# Patient Record
Sex: Female | Born: 1955
Health system: Southern US, Community
[De-identification: ages and names within clinical notes are randomized; demographics above are authoritative.]

## PROBLEM LIST (undated history)

## (undated) DIAGNOSIS — E669 Obesity, unspecified: Secondary | ICD-10-CM

## (undated) DIAGNOSIS — R112 Nausea with vomiting, unspecified: Secondary | ICD-10-CM

## (undated) DIAGNOSIS — E039 Hypothyroidism, unspecified: Secondary | ICD-10-CM

## (undated) DIAGNOSIS — Z9889 Other specified postprocedural states: Secondary | ICD-10-CM

## (undated) DIAGNOSIS — K5790 Diverticulosis of intestine, part unspecified, without perforation or abscess without bleeding: Secondary | ICD-10-CM

## (undated) DIAGNOSIS — H8110 Benign paroxysmal vertigo, unspecified ear: Secondary | ICD-10-CM

## (undated) DIAGNOSIS — K219 Gastro-esophageal reflux disease without esophagitis: Secondary | ICD-10-CM

## (undated) DIAGNOSIS — H269 Unspecified cataract: Secondary | ICD-10-CM

## (undated) DIAGNOSIS — M1990 Unspecified osteoarthritis, unspecified site: Secondary | ICD-10-CM

## (undated) DIAGNOSIS — E785 Hyperlipidemia, unspecified: Secondary | ICD-10-CM

## (undated) HISTORY — PX: MANDIBLE SURGERY: SHX707

## (undated) HISTORY — DX: Hyperlipidemia, unspecified: E78.5

## (undated) HISTORY — PX: POLYPECTOMY: SHX149

## (undated) HISTORY — DX: Unspecified osteoarthritis, unspecified site: M19.90

## (undated) HISTORY — DX: Gastro-esophageal reflux disease without esophagitis: K21.9

## (undated) HISTORY — DX: Diverticulosis of intestine, part unspecified, without perforation or abscess without bleeding: K57.90

## (undated) HISTORY — DX: Hypothyroidism, unspecified: E03.9

## (undated) HISTORY — PX: HEMORRHOID BANDING: SHX5850

## (undated) HISTORY — PX: COLONOSCOPY W/ POLYPECTOMY: SHX1380

## (undated) HISTORY — PX: COLONOSCOPY: SHX174

## (undated) HISTORY — PX: OTHER SURGICAL HISTORY: SHX169

## (undated) HISTORY — DX: Obesity, unspecified: E66.9

## (undated) HISTORY — DX: Unspecified cataract: H26.9

---

## 2014-07-27 ENCOUNTER — Telehealth: Payer: Self-pay

## 2014-07-27 NOTE — Telephone Encounter (Signed)
Pt came by the office and left a copy of her insurance care and wants to be scheduled for colonoscopy.  Her PCP in [place] is retiring and she will be seeing new PCP in a week or so. She said she will get back with me then, she wants then to send the referral to [place]. She will call.

## 2014-08-05 ENCOUNTER — Encounter (INDEPENDENT_AMBULATORY_CARE_PROVIDER_SITE_OTHER): Payer: Self-pay | Admitting: *Deleted

## 2014-09-07 ENCOUNTER — Encounter (INDEPENDENT_AMBULATORY_CARE_PROVIDER_SITE_OTHER): Payer: Self-pay | Admitting: *Deleted

## 2014-10-11 ENCOUNTER — Ambulatory Visit (INDEPENDENT_AMBULATORY_CARE_PROVIDER_SITE_OTHER): Payer: Self-pay | Admitting: Internal Medicine

## 2014-12-07 ENCOUNTER — Ambulatory Visit (INDEPENDENT_AMBULATORY_CARE_PROVIDER_SITE_OTHER): Payer: PRIVATE HEALTH INSURANCE | Admitting: Internal Medicine

## 2014-12-07 ENCOUNTER — Telehealth (INDEPENDENT_AMBULATORY_CARE_PROVIDER_SITE_OTHER): Payer: Self-pay | Admitting: *Deleted

## 2014-12-07 ENCOUNTER — Other Ambulatory Visit (INDEPENDENT_AMBULATORY_CARE_PROVIDER_SITE_OTHER): Payer: Self-pay | Admitting: *Deleted

## 2014-12-07 ENCOUNTER — Encounter (INDEPENDENT_AMBULATORY_CARE_PROVIDER_SITE_OTHER): Payer: Self-pay | Admitting: Internal Medicine

## 2014-12-07 VITALS — BP 132/60 | HR 60 | Temp 98.4°F | Ht 68.0 in | Wt 195.0 lb

## 2014-12-07 DIAGNOSIS — K219 Gastro-esophageal reflux disease without esophagitis: Secondary | ICD-10-CM | POA: Diagnosis not present

## 2014-12-07 DIAGNOSIS — Z1211 Encounter for screening for malignant neoplasm of colon: Secondary | ICD-10-CM

## 2014-12-07 MED ORDER — PEG 3350-KCL-NA BICARB-NACL 420 G PO SOLR: 4000.0000 mL | Freq: Once | ORAL | Status: DC

## 2014-12-07 NOTE — Progress Notes (Signed)
   Subjective:    Patient ID: Cynthia Ellison, female    DOB: 1956-02-22, 59 y.o.   MRN: [id number]  HPI 59 yr old female referred by Dr. Manuella Ghazi for GERD. Patient is due for a colonoscopy this GERD. She tells me her reflux is worse. Prilosec is not controlling her symptoms completely. She is concerned about Barrett's esophagus. 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 She is not having any dysphagia. She does cough a lot at night. Her appetite is good. No weight loss. She denies any abdominal. She usually has a BM daily. No melena or BRRB. She is not exercising.    Her last colonoscopy was by Dr. Paul Dykes in 2006 and an EGD in 2008 for dysphagia, and GERD. Colonoscopy revealed Hyperplastic polyp. Diverticulosis, Enlarged internal hemorrhoids.  EGD in 2008 revealed: stricture of the GE junction, small hiatal hernia, Polyps in the stomach (biopsy). Otherwise normal EGD. (No biopsy sent).  .Review of Systems Past Medical History  Diagnosis Date  . GERD (gastroesophageal reflux disease)     Past Surgical History  Procedure Laterality Date  . Mandible surgery      hypoplasia.    No Known Allergies  No current outpatient prescriptions on file prior to visit.   No current facility-administered medications on file prior to visit.    Married. No children. 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  Alert and oriented. Skin warm and dry. Oral mucosa is moist.   . Sclera anicteric, conjunctivae is pink. Thyroid not enlarged. No cervical lymphadenopathy. Lungs clear. Heart regular rate and rhythm.  Abdomen is soft. Bowel sounds are positive. No hepatomegaly. No abdominal masses felt. No tenderness.  No edema to lower extremities.         Assessment & Plan:  GERD: not controlled at this time. She wants to wait till after her EGD before changing the  Prilosec. Screening colonoscopy.  Last colonoscopy 10 yrs ago.

## 2014-12-07 NOTE — Telephone Encounter (Signed)
Patient needs trilyte 

## 2014-12-07 NOTE — Patient Instructions (Signed)
EGD/Colonoscopy. The risks and benefits such as perforation, bleeding, and infection were reviewed with the patient and is agreeable. 

## 2014-12-09 ENCOUNTER — Encounter (INDEPENDENT_AMBULATORY_CARE_PROVIDER_SITE_OTHER): Payer: Self-pay

## 2015-01-11 ENCOUNTER — Other Ambulatory Visit (INDEPENDENT_AMBULATORY_CARE_PROVIDER_SITE_OTHER): Payer: Self-pay | Admitting: *Deleted

## 2015-01-11 DIAGNOSIS — Z1211 Encounter for screening for malignant neoplasm of colon: Secondary | ICD-10-CM

## 2015-02-09 ENCOUNTER — Encounter (HOSPITAL_COMMUNITY): Admission: RE | Payer: Self-pay | Source: Ambulatory Visit

## 2015-02-09 ENCOUNTER — Ambulatory Visit (HOSPITAL_COMMUNITY)
Admission: RE | Admit: 2015-02-09 | Discharge: 2015-02-09 | Disposition: A | Discharge status: Home / Self Care | Payer: PRIVATE HEALTH INSURANCE | Source: Ambulatory Visit | Attending: Internal Medicine | Admitting: Internal Medicine

## 2015-02-09 ENCOUNTER — Encounter (HOSPITAL_COMMUNITY)
Admission: RE | Disposition: A | Discharge status: Home / Self Care | Payer: Self-pay | Source: Ambulatory Visit | Attending: Internal Medicine

## 2015-02-09 ENCOUNTER — Encounter (HOSPITAL_COMMUNITY): Payer: Self-pay | Admitting: *Deleted

## 2015-02-09 ENCOUNTER — Ambulatory Visit (HOSPITAL_COMMUNITY)
Admission: RE | Admit: 2015-02-09 | Payer: PRIVATE HEALTH INSURANCE | Source: Ambulatory Visit | Admitting: Internal Medicine

## 2015-02-09 DIAGNOSIS — K5730 Diverticulosis of large intestine without perforation or abscess without bleeding: Secondary | ICD-10-CM | POA: Diagnosis not present

## 2015-02-09 DIAGNOSIS — K5790 Diverticulosis of intestine, part unspecified, without perforation or abscess without bleeding: Secondary | ICD-10-CM | POA: Diagnosis not present

## 2015-02-09 DIAGNOSIS — K219 Gastro-esophageal reflux disease without esophagitis: Secondary | ICD-10-CM | POA: Insufficient documentation

## 2015-02-09 DIAGNOSIS — H8110 Benign paroxysmal vertigo, unspecified ear: Secondary | ICD-10-CM | POA: Insufficient documentation

## 2015-02-09 DIAGNOSIS — K449 Diaphragmatic hernia without obstruction or gangrene: Secondary | ICD-10-CM | POA: Insufficient documentation

## 2015-02-09 DIAGNOSIS — D649 Anemia, unspecified: Secondary | ICD-10-CM | POA: Diagnosis not present

## 2015-02-09 DIAGNOSIS — K644 Residual hemorrhoidal skin tags: Secondary | ICD-10-CM | POA: Insufficient documentation

## 2015-02-09 DIAGNOSIS — K317 Polyp of stomach and duodenum: Secondary | ICD-10-CM | POA: Diagnosis not present

## 2015-02-09 DIAGNOSIS — K222 Esophageal obstruction: Secondary | ICD-10-CM | POA: Insufficient documentation

## 2015-02-09 DIAGNOSIS — Z1211 Encounter for screening for malignant neoplasm of colon: Secondary | ICD-10-CM | POA: Diagnosis not present

## 2015-02-09 DIAGNOSIS — K648 Other hemorrhoids: Secondary | ICD-10-CM | POA: Diagnosis not present

## 2015-02-09 DIAGNOSIS — K6389 Other specified diseases of intestine: Secondary | ICD-10-CM | POA: Insufficient documentation

## 2015-02-09 DIAGNOSIS — Z79899 Other long term (current) drug therapy: Secondary | ICD-10-CM | POA: Insufficient documentation

## 2015-02-09 DIAGNOSIS — R1310 Dysphagia, unspecified: Secondary | ICD-10-CM | POA: Diagnosis not present

## 2015-02-09 HISTORY — PX: COLONOSCOPY: SHX5424

## 2015-02-09 HISTORY — DX: Other specified postprocedural states: Z98.890

## 2015-02-09 HISTORY — DX: Nausea with vomiting, unspecified: R11.2

## 2015-02-09 HISTORY — DX: Benign paroxysmal vertigo, unspecified ear: H81.10

## 2015-02-09 HISTORY — PX: ESOPHAGOGASTRODUODENOSCOPY: SHX5428

## 2015-02-09 SURGERY — COLONOSCOPY
Anesthesia: Moderate Sedation

## 2015-02-09 MED ORDER — BUTAMBEN-TETRACAINE-BENZOCAINE 2-2-14 % EX AERO
INHALATION_SPRAY | CUTANEOUS | Status: DC | PRN
  Administered 2015-02-09: 2 via TOPICAL

## 2015-02-09 MED ORDER — MIDAZOLAM HCL 5 MG/5ML IJ SOLN
INTRAMUSCULAR | Status: AC
  Filled 2015-02-09: qty 10

## 2015-02-09 MED ORDER — SODIUM CHLORIDE 0.9 % IV SOLN
INTRAVENOUS | Status: DC
  Administered 2015-02-09: 12:00:00 via INTRAVENOUS

## 2015-02-09 MED ORDER — STERILE WATER FOR IRRIGATION IR SOLN
Status: DC | PRN
  Administered 2015-02-09: 12:00:00

## 2015-02-09 MED ORDER — SODIUM CHLORIDE 0.9 % IV SOLN: INTRAVENOUS | Status: DC

## 2015-02-09 MED ORDER — MEPERIDINE HCL 50 MG/ML IJ SOLN
INTRAMUSCULAR | Status: DC
Start: 2015-02-09 — End: 2015-02-09
  Filled 2015-02-09: qty 1

## 2015-02-09 MED ORDER — MEPERIDINE HCL 50 MG/ML IJ SOLN
INTRAMUSCULAR | Status: DC | PRN
  Administered 2015-02-09 (×3): 25 mg via INTRAVENOUS

## 2015-02-09 MED ORDER — MIDAZOLAM HCL 5 MG/5ML IJ SOLN
INTRAMUSCULAR | Status: DC | PRN
  Administered 2015-02-09 (×6): 2 mg via INTRAVENOUS

## 2015-02-09 MED ORDER — MIDAZOLAM HCL 5 MG/5ML IJ SOLN
INTRAMUSCULAR | Status: AC
  Filled 2015-02-09: qty 5

## 2015-02-09 MED ORDER — OMEPRAZOLE 40 MG PO CPDR: 40.0000 mg | DELAYED_RELEASE_CAPSULE | Freq: Every day | ORAL | Status: DC

## 2015-02-09 MED ORDER — MEPERIDINE HCL 50 MG/ML IJ SOLN
INTRAMUSCULAR | Status: AC
  Filled 2015-02-09: qty 1

## 2015-02-09 NOTE — Discharge Instructions (Signed)
Resume usual medications and high fiber diet. Increase omeprazole to 40 mg by mouth 30 minutes before breakfast daily. No driving for 24 hours. Office visit in 3 months.  High-Fiber Diet Fiber is found in fruits, vegetables, and grains. A high-fiber diet encourages the addition of more whole grains, legumes, fruits, and vegetables in your diet. The recommended amount of fiber for adult males is 38 g per day. For adult females, it is 25 g per day. Pregnant and lactating women should get 28 g of fiber per day. If you have a digestive or bowel problem, ask your caregiver for advice before adding high-fiber foods to your diet. Eat a variety of high-fiber foods instead of only a select few type of foods.  PURPOSE  To increase stool bulk.  To make bowel movements more regular to prevent constipation.  To lower cholesterol.  To prevent overeating. WHEN IS THIS DIET USED?  It may be used if you have constipation and hemorrhoids.  It may be used if you have uncomplicated diverticulosis (intestine condition) and irritable bowel syndrome.  It may be used if you need help with weight management.  It may be used if you want to add it to your diet as a protective measure against atherosclerosis, diabetes, and cancer. SOURCES OF FIBER  Whole-grain breads and cereals.  Fruits, such as apples, oranges, bananas, berries, prunes, and pears.  Vegetables, such as green peas, carrots, sweet potatoes, beets, broccoli, cabbage, spinach, and artichokes.  Legumes, such split peas, soy, lentils.  Almonds. FIBER CONTENT IN FOODS Starches and Grains / Dietary Fiber (g)  Cheerios, 1 cup / 3 g  Corn Flakes cereal, 1 cup / 0.7 g  Rice crispy treat cereal, 1 cup / 0.3 g  Instant oatmeal (cooked),  cup / 2 g  Frosted wheat cereal, 1 cup / 5.1 g  Brown, long-grain rice (cooked), 1 cup / 3.5 g  White, long-grain rice (cooked), 1 cup / 0.6 g  Enriched macaroni (cooked), 1 cup / 2.5 g Legumes /  Dietary Fiber (g)  Baked beans (canned, plain, or vegetarian),  cup / 5.2 g  Kidney beans (canned),  cup / 6.8 g  Pinto beans (cooked),  cup / 5.5 g Breads and Crackers / Dietary Fiber (g)  Plain or honey graham crackers, 2 squares / 0.7 g  Saltine crackers, 3 squares / 0.3 g  Plain, salted pretzels, 10 pieces / 1.8 g  Whole-wheat bread, 1 slice / 1.9 g  White bread, 1 slice / 0.7 g  Raisin bread, 1 slice / 1.2 g  Plain bagel, 3 oz / 2 g  Flour tortilla, 1 oz / 0.9 g  Corn tortilla, 1 small / 1.5 g  Hamburger or hotdog bun, 1 small / 0.9 g Fruits / Dietary Fiber (g)  Apple with skin, 1 medium / 4.4 g  Sweetened applesauce,  cup / 1.5 g  Banana,  medium / 1.5 g  Grapes, 10 grapes / 0.4 g  Orange, 1 small / 2.3 g  Raisin, 1.5 oz / 1.6 g  Melon, 1 cup / 1.4 g Vegetables / Dietary Fiber (g)  Green beans (canned),  cup / 1.3 g  Carrots (cooked),  cup / 2.3 g  Broccoli (cooked),  cup / 2.8 g  Peas (cooked),  cup / 4.4 g  Mashed potatoes,  cup / 1.6 g  Lettuce, 1 cup / 0.5 g  Corn (canned),  cup / 1.6 g  Tomato,  cup / 1.1 g Document  Released: 08/05/2005 Document Revised: 02/04/2012 Document Reviewed: 11/07/2011 [facility] Patient Information 2015 [place], [place]. This information is not intended to replace advice given to you by your health care provider. Make sure you discuss any questions you have with your health care provider.  Esophagogastroduodenoscopy Care After Refer to this sheet in the next few weeks. These instructions provide you with information on caring for yourself after your procedure. Your caregiver may also give you more specific instructions. Your treatment has been planned according to current medical practices, but problems sometimes occur. Call your caregiver if you have any problems or questions after your procedure.  HOME CARE INSTRUCTIONS  Do not eat or drink anything until the numbing medicine (local anesthetic) has  worn off and your gag reflex has returned. You will know that the local anesthetic has worn off when you can swallow comfortably.  Do not drive for 12 hours after the procedure or as directed by your caregiver.  Only take medicines as directed by your caregiver. SEEK MEDICAL CARE IF:   You cannot stop coughing.  You are not urinating at all or less than usual. SEEK IMMEDIATE MEDICAL CARE IF:  You have difficulty swallowing.  You cannot eat or drink.  You have worsening throat or chest pain.  You have dizziness, lightheadedness, or you faint.  You have nausea or vomiting.  You have chills.  You have a fever.  You have severe abdominal pain.  You have black, tarry, or bloody stools. Document Released: 07/22/2012 Document Reviewed: 07/22/2012 [facility] Patient Information 2015 [place]. This information is not intended to replace advice given to you by your health care provider. Make sure you discuss any questions you have with your health care provider.  PATIENT INSTRUCTIONS POST-ANESTHESIA  IMMEDIATELY FOLLOWING SURGERY:  Do not drive or operate machinery for the first twenty four hours after surgery.  Do not make any important decisions for twenty four hours after surgery or while taking narcotic pain medications or sedatives.  If you develop intractable nausea and vomiting or a severe headache please notify your doctor immediately.  FOLLOW-UP:  Please make an appointment with your surgeon as instructed. You do not need to follow up with anesthesia unless specifically instructed to do so.  WOUND CARE INSTRUCTIONS (if applicable):  Keep a dry clean dressing on the anesthesia/puncture wound site if there is drainage.  Once the wound has quit draining you may leave it open to air.  Generally you should leave the bandage intact for twenty four hours unless there is drainage.  If the epidural site drains for more than 36-48 hours please call the anesthesia  department.  QUESTIONS?:  Please feel free to call your physician or the hospital operator if you have any questions, and they will be happy to assist you.

## 2015-02-09 NOTE — H&P (Signed)
Cynthia Ellison is an 59 y.o. female.   Chief Complaint: Patient is here for EGD, ED and colonoscopy. HPI: Patient is 59-year-old Caucasian female who is here for average risk screening colonoscopy. Her last exam was 10 years ago with removal of single polyp which was non-adenomatous. She she also has chronic GERD. She is on omeprazole at heartburn is not controlled to her satisfaction. She did try Nexium but wasn't any better. She has history of Schatzki's ring. Was last dilated in 2008. She did have an episode of solid food dysphagia and she was eating an egg. She had spontaneous early within few minutes. She denies nausea vomiting melena or rectal bleeding. Family history is negative for CRC.  Past Medical History  Diagnosis Date  . GERD (gastroesophageal reflux disease)   . Vertigo, benign paroxysmal   . PONV (postoperative nausea and vomiting)     Past Surgical History  Procedure Laterality Date  . Mandible surgery      hypoplasia.  . Colonoscopy w/ polypectomy    . Bilateral toe surgery      History reviewed. No pertinent family history. Social History:  reports that she has never smoked. She does not have any smokeless tobacco history on file. She reports that she drinks alcohol. She reports that she does not use illicit drugs.  Allergies: No Known Allergies  Medications Prior to Admission  Medication Sig Dispense Refill  . BIOTIN PO Take 1 tablet by mouth daily.    . Cholecalciferol (VITAMIN D3) 5000 UNITS CAPS Take 1 capsule by mouth daily.    . Fish Oil-Cholecalciferol (FISH OIL + D3 PO) Take 2 capsules by mouth 2 (two) times daily.     . IODINE, KELP, PO Take 2 capsules by mouth 2 (two) times daily.    . Magnesium 200 MG TABS Take 1 tablet by mouth 2 (two) times daily.    . Misc Natural Products (PROGESTERONE EX) Apply 1 application topically daily. Use 3 weeks out of the month.    . Multiple Vitamin (MULTIVITAMIN) tablet Take 2 tablets by mouth 3 (three) times daily.     .  Multiple Vitamins-Minerals (ZINC PO) Take by mouth.    Marland Kitchen omeprazole (PRILOSEC) 20 MG capsule Take 20 mg by mouth daily.    . polyethylene glycol-electrolytes (NULYTELY/GOLYTELY) 420 G solution Take 4,000 mLs by mouth once. 4000 mL 0  . PRESCRIPTION MEDICATION Apply 1 application topically daily. Estradiol/Testosterone    . vitamin E 400 UNIT capsule Take 400 Units by mouth 3 (three) times a week.      No results found for this or any previous visit (from the past 48 hour(s)). No results found.  ROS  Blood pressure 127/83, pulse 65, temperature 98.2 F (36.8 C), resp. rate 20, height 5\' 8"  (1.727 m), weight 186 lb (84.369 kg), SpO2 97 %. Physical Exam  Constitutional: She appears well-developed and well-nourished.  HENT:  Mouth/Throat: Oropharynx is clear and moist.  Eyes: Conjunctivae are normal. No scleral icterus.  Neck: No thyromegaly present.  Cardiovascular: Normal rate, regular rhythm and normal heart sounds.   No murmur heard. Respiratory: Effort normal and breath sounds normal.  GI: Soft. She exhibits no distension and no mass. There is no tenderness.  Musculoskeletal: She exhibits no edema.  Lymphadenopathy:    She has no cervical adenopathy.  Neurological: She is alert.  Skin: Skin is warm and dry.     Assessment/Plan Chronic GERD unsatisfactory symptom controlled with PPI. Solid food dysphagia. EGD with EGD and  average risk screening colonoscopy.  Cynthia Ellison 02/09/2015, 11:56 AM

## 2015-02-09 NOTE — Op Note (Signed)
EGD PROCEDURE REPORT  PATIENT:  Cynthia Ellison  MR#:  [id number] Birthdate:  May 07, 1956, 59 y.o., female Endoscopist:  Dr. Rogene Houston, MD Referred By:  Dr. Monico Blitz, MD Procedure Date: 02/09/2015  Procedure:   EGD with ED & Colonoscopy  Indications:  Patient is 59 year-old Caucasian female who has chronic GERD symptoms are not well controlled with omeprazole. She is also experience dysphagia to solids lately. She is also undergoing average risk screening colonoscopy.            Informed Consent:  The risks, benefits, alternatives & imponderables which include, but are not limited to, bleeding, infection, perforation, drug reaction and potential missed lesion have been reviewed.  The potential for biopsy, lesion removal, esophageal dilation, etc. have also been discussed.  Questions have been answered.  All parties agreeable.  Please see history & physical in medical record for more information.  Medications:  Demerol 75 mg IV Versed 12 mg IV Cetacaine spray topically for oropharyngeal anesthesia  EGD  Description of procedure:  The endoscope was introduced through the mouth and advanced to the second portion of the duodenum without difficulty or limitations. The mucosal surfaces were surveyed very carefully during advancement of the scope and upon withdrawal.  Findings:  Esophagus:  Mucosa of the esophagus normal. Noncritical ring noted at GE junction. GEJ:  35 cm Hiatus:  39 cm Stomach:  Stomach was empty and distended very well with insufflation. Folds in the proximal stomach were normal. Examination mucosa revealed multiple small polyps measuring 4-6 mm at gastric body and fundus with appearance of hyperplastic polyps. These were left alone. Pyloric channel was patent. Angularis and cardia were unremarkable. Duodenum:  Normal bulbar and post bulbar mucosa.  Therapeutic/Diagnostic Maneuvers Performed:  Schatzki's ring was dilated with a balloon dilator. Balloon dilator was passed  through the scope. Guidewire was pushed into gastric lumen. Balloon dilator was positioned across Schatzki's ring and insufflated to diameter of 18 and subsequently 19 mm and passed distally but ring was still intact. Ring was disrupted with focal biopsy at three sites. No tissue was saved.  COLONOSCOPY Description of procedure:  After a digital rectal exam was performed, that colonoscope was advanced from the anus through the rectum and colon to the area of the cecum, ileocecal valve and appendiceal orifice. The cecum was deeply intubated. These structures were well-seen and photographed for the record. From the level of the cecum and ileocecal valve, the scope was slowly and cautiously withdrawn. The mucosal surfaces were carefully surveyed utilizing scope tip to flexion to facilitate fold flattening as needed. The scope was pulled down into the rectum where a thorough exam including retroflexion was performed.  Findings:   Prep excellent. Redundant colon. Multiple diverticula noted in the region of hepatic flexure and sigmoid colon. Mild mucosal pigmentation involving distal half of the colon. Normal rectal mucosa. Small hemorrhoids below the dentate line along with 3 anal papillae.  Therapeutic/Diagnostic Maneuvers Performed:  None  Complications:  None  Cecal Withdrawal Time:  7 minutes  Impression:  EGD findings; Noncritical Schatzki's ring and  moderate-sized sliding hiatal hernia. Multiple hyperplastic appearing small gastric polyps at body and fundus. Ring dilated with balloon dilator to 19 mm but was still intact and therefore disrupted with focal biopsy at 3 sites.  Colonoscopy findings; Examination performed to cecum. Multiple diverticula at hepatic flexure and sigmoid colon. Redundant colon with mild melanosis coli. Small external hemorrhoids in anal anal papillae  Recommendations:  Standard instructions given. Increase omeprazole  to 40 mg by mouth every morning. Office  visit in 3 months.  Philippe Gang U  02/09/2015 1:22 PM  CC: Dr. Monico Blitz, MD & Dr. Rayne Du ref. provider found

## 2015-02-14 ENCOUNTER — Encounter (HOSPITAL_COMMUNITY): Payer: Self-pay | Admitting: Internal Medicine

## 2015-04-04 ENCOUNTER — Encounter (INDEPENDENT_AMBULATORY_CARE_PROVIDER_SITE_OTHER): Payer: Self-pay | Admitting: *Deleted

## 2015-05-15 ENCOUNTER — Ambulatory Visit (INDEPENDENT_AMBULATORY_CARE_PROVIDER_SITE_OTHER): Payer: PRIVATE HEALTH INSURANCE | Admitting: Internal Medicine

## 2015-05-18 ENCOUNTER — Ambulatory Visit (INDEPENDENT_AMBULATORY_CARE_PROVIDER_SITE_OTHER): Payer: PRIVATE HEALTH INSURANCE | Admitting: Internal Medicine

## 2015-05-18 ENCOUNTER — Encounter (INDEPENDENT_AMBULATORY_CARE_PROVIDER_SITE_OTHER): Payer: Self-pay | Admitting: Internal Medicine

## 2015-05-18 VITALS — BP 108/80 | HR 72 | Temp 98.6°F | Ht 68.0 in | Wt 177.7 lb

## 2015-05-18 DIAGNOSIS — K219 Gastro-esophageal reflux disease without esophagitis: Secondary | ICD-10-CM | POA: Diagnosis not present

## 2015-05-18 DIAGNOSIS — R1314 Dysphagia, pharyngoesophageal phase: Secondary | ICD-10-CM | POA: Diagnosis not present

## 2015-05-18 NOTE — Patient Instructions (Signed)
OV in one year. 

## 2015-05-18 NOTE — Progress Notes (Signed)
Subjective:    Patient ID: Cynthia Ellison, female    DOB: 1956-04-24, 59 y.o.   MRN: [id number]  HPI Here today for f/u after undergoing an EGD/ED and colonoscopy in June. Underwent EGD/ED for dysphagia and chronic GERD. Has been on Omeprazole BID. She has lost 18 pounds since her last OV which was intentional. Appetite has been good. BMs are normal. She ooccasional has acid reflux.She is off the Prilosec. Shas intentional weight loss of about 18 pounds.        02/09/2015 EGD with ED & Colonoscopy  Indications: Patient is 59 year-old Caucasian female who has chronic GERD symptoms are not well controlled with omeprazole. She is also experience dysphagia to solids lately. She is also undergoing average risk screening   Impression:  EGD findings; Noncritical Schatzki's ring and moderate-sized sliding hiatal hernia. Multiple hyperplastic appearing small gastric polyps at body and fundus. Ring dilated with balloon dilator to 19 mm but was still intact and therefore disrupted with focal biopsy at 3 sites.  Colonoscopy findings; Examination performed to cecum. Multiple diverticula at hepatic flexure and sigmoid colon. Redundant colon with mild melanosis coli. Small external hemorrhoids in anal anal papillae Review of Systems Past Medical History  Diagnosis Date  . GERD (gastroesophageal reflux disease)   . Vertigo, benign paroxysmal   . PONV (postoperative nausea and vomiting)     Past Surgical History  Procedure Laterality Date  . Mandible surgery      hypoplasia.  . Colonoscopy w/ polypectomy    . Bilateral toe surgery    . Colonoscopy N/A 02/09/2015    Procedure: COLONOSCOPY;  Surgeon: Rogene Houston, MD;  Location: [facility];  Service: Endoscopy;  Laterality: N/A;  1200  . Esophagogastroduodenoscopy N/A 02/09/2015    Procedure: ESOPHAGOGASTRODUODENOSCOPY (EGD);  Surgeon: Rogene Houston, MD;  Location: [facility];  Service: Endoscopy;  Laterality: N/A;    No  Known Allergies  Current Outpatient Prescriptions on File Prior to Visit  Medication Sig Dispense Refill  . BIOTIN PO Take 1 tablet by mouth daily.    . Cholecalciferol (VITAMIN D3) 5000 UNITS CAPS Take 1 capsule by mouth daily.    . Fish Oil-Cholecalciferol (FISH OIL + D3 PO) Take 2 capsules by mouth 2 (two) times daily.     . IODINE, KELP, PO Take 2 capsules by mouth 2 (two) times daily.    . Magnesium 200 MG TABS Take 1 tablet by mouth 2 (two) times daily.    . Misc Natural Products (PROGESTERONE EX) Apply 1 application topically daily. Use 3 weeks out of the month.    . Multiple Vitamin (MULTIVITAMIN) tablet Take 2 tablets by mouth 3 (three) times daily.     . Multiple Vitamins-Minerals (ZINC PO) Take by mouth.    Marland Kitchen omeprazole (PRILOSEC) 40 MG capsule Take 1 capsule (40 mg total) by mouth daily before breakfast. 30 capsule 3  . PRESCRIPTION MEDICATION Apply 1 application topically daily. Estradiol/Testosterone    . vitamin E 400 UNIT capsule Take 400 Units by mouth 3 (three) times a week.     No current facility-administered medications on file prior to visit.        Objective:   Physical ExamBlood pressure 108/80, pulse 72, temperature 98.6 F (37 C), height 5\' 8"  (1.727 m), weight 177 lb 11.2 oz (80.604 kg). Alert and oriented. Skin warm and dry. Oral mucosa is moist.   . Sclera anicteric, conjunctivae is pink. Thyroid not enlarged. No cervical lymphadenopathy. Lungs clear.  Heart regular rate and rhythm.  Abdomen is soft. Bowel sounds are positive. No hepatomegaly. No abdominal masses felt. No tenderness.  No edema to lower extremities.            Assessment & Plan:  GERD controlled at this time. Will drop Omeprazole back to once a day. OV in one year.

## 2019-10-20 DIAGNOSIS — Z299 Encounter for prophylactic measures, unspecified: Secondary | ICD-10-CM | POA: Diagnosis not present

## 2019-10-20 DIAGNOSIS — E7800 Pure hypercholesterolemia, unspecified: Secondary | ICD-10-CM | POA: Diagnosis not present

## 2019-10-20 DIAGNOSIS — Z789 Other specified health status: Secondary | ICD-10-CM | POA: Diagnosis not present

## 2019-10-20 DIAGNOSIS — E039 Hypothyroidism, unspecified: Secondary | ICD-10-CM | POA: Diagnosis not present

## 2019-10-26 DIAGNOSIS — M79671 Pain in right foot: Secondary | ICD-10-CM | POA: Diagnosis not present

## 2019-10-26 DIAGNOSIS — M2041 Other hammer toe(s) (acquired), right foot: Secondary | ICD-10-CM | POA: Diagnosis not present

## 2019-10-26 DIAGNOSIS — M79674 Pain in right toe(s): Secondary | ICD-10-CM | POA: Diagnosis not present

## 2019-11-11 DIAGNOSIS — Z7189 Other specified counseling: Secondary | ICD-10-CM | POA: Diagnosis not present

## 2019-11-11 DIAGNOSIS — Z1211 Encounter for screening for malignant neoplasm of colon: Secondary | ICD-10-CM | POA: Diagnosis not present

## 2019-11-11 DIAGNOSIS — E7800 Pure hypercholesterolemia, unspecified: Secondary | ICD-10-CM | POA: Diagnosis not present

## 2019-11-11 DIAGNOSIS — Z299 Encounter for prophylactic measures, unspecified: Secondary | ICD-10-CM | POA: Diagnosis not present

## 2019-11-11 DIAGNOSIS — Z0000 Encounter for general adult medical examination without abnormal findings: Secondary | ICD-10-CM | POA: Diagnosis not present

## 2019-11-11 DIAGNOSIS — Z79899 Other long term (current) drug therapy: Secondary | ICD-10-CM | POA: Diagnosis not present

## 2019-11-11 DIAGNOSIS — E039 Hypothyroidism, unspecified: Secondary | ICD-10-CM | POA: Diagnosis not present

## 2019-11-29 DIAGNOSIS — M2041 Other hammer toe(s) (acquired), right foot: Secondary | ICD-10-CM | POA: Diagnosis not present

## 2019-11-29 DIAGNOSIS — M79671 Pain in right foot: Secondary | ICD-10-CM | POA: Diagnosis not present

## 2019-11-29 DIAGNOSIS — M7741 Metatarsalgia, right foot: Secondary | ICD-10-CM | POA: Diagnosis not present

## 2019-11-29 DIAGNOSIS — M25571 Pain in right ankle and joints of right foot: Secondary | ICD-10-CM | POA: Diagnosis not present

## 2020-01-31 DIAGNOSIS — R5383 Other fatigue: Secondary | ICD-10-CM | POA: Diagnosis not present

## 2020-01-31 DIAGNOSIS — E559 Vitamin D deficiency, unspecified: Secondary | ICD-10-CM | POA: Diagnosis not present

## 2020-01-31 DIAGNOSIS — E782 Mixed hyperlipidemia: Secondary | ICD-10-CM | POA: Diagnosis not present

## 2020-01-31 DIAGNOSIS — E018 Other iodine-deficiency related thyroid disorders and allied conditions: Secondary | ICD-10-CM | POA: Diagnosis not present

## 2020-01-31 DIAGNOSIS — E039 Hypothyroidism, unspecified: Secondary | ICD-10-CM | POA: Diagnosis not present

## 2020-05-24 DIAGNOSIS — L814 Other melanin hyperpigmentation: Secondary | ICD-10-CM | POA: Diagnosis not present

## 2020-05-24 DIAGNOSIS — D225 Melanocytic nevi of trunk: Secondary | ICD-10-CM | POA: Diagnosis not present

## 2020-05-24 DIAGNOSIS — L570 Actinic keratosis: Secondary | ICD-10-CM | POA: Diagnosis not present

## 2020-05-24 DIAGNOSIS — D1801 Hemangioma of skin and subcutaneous tissue: Secondary | ICD-10-CM | POA: Diagnosis not present

## 2020-05-24 DIAGNOSIS — L821 Other seborrheic keratosis: Secondary | ICD-10-CM | POA: Diagnosis not present

## 2020-06-21 ENCOUNTER — Encounter: Payer: Self-pay | Admitting: Gastroenterology

## 2020-08-10 ENCOUNTER — Encounter: Payer: Self-pay | Admitting: Gastroenterology

## 2020-08-10 ENCOUNTER — Ambulatory Visit: Payer: PRIVATE HEALTH INSURANCE | Admitting: Gastroenterology

## 2020-08-10 VITALS — BP 124/70 | HR 59 | Ht 68.0 in | Wt 181.0 lb

## 2020-08-10 DIAGNOSIS — R1310 Dysphagia, unspecified: Secondary | ICD-10-CM

## 2020-08-10 DIAGNOSIS — R1013 Epigastric pain: Secondary | ICD-10-CM | POA: Diagnosis not present

## 2020-08-10 DIAGNOSIS — K824 Cholesterolosis of gallbladder: Secondary | ICD-10-CM

## 2020-08-10 NOTE — Patient Instructions (Signed)
You have been scheduled for an abdominal ultrasound at [facility] (1st floor of hospital) on 08/28/20 at 10:30am. Please arrive 15 minutes prior to your appointment for registration. Make certain not to have anything to eat or drink 6 hours prior to your appointment. Should you need to reschedule your appointment, please contact radiology at [phone]. This test typically takes about 30 minutes to perform.  You have been scheduled for an endoscopy. Please follow written instructions given to you at your visit today. If you use inhalers (even only as needed), please bring them with you on the day of your procedure.

## 2020-08-10 NOTE — Progress Notes (Signed)
Referring Provider: Monico Blitz, MD Primary Care Physician:  Monico Blitz, MD  Reason for Consultation:   Dysphagia   IMPRESSION:  Epigastric pain - ? dyspepsia Recurrent, intermittent solid food dysphagia with history of esophageal stricture s/p dilation 2008    - widely patent Schatzki's ring on EGD 2016, not disrupted by 90mm TTS balloon Gallbladder polyps on ultrasound 2008 and 2011 Fundic gland polyps on prior EGD Colonoscopy for colon cancer screening 2006 and 2016    - hyperplastic polyp in 2006    - normal colonoscopy 2016 Diverticulosis on colonoscopy No known family history of colon cancer or polyps  PLAN: - EGD with biopsy +/- dilation to further evaluate her abdominal pain and dyspahgia - Abdominal ultrasound to follow-up on her gladder polyps - Proceed with esophagram if EGD is normal - Colonoscopy for colon cancer screening 2026, earlier with new symptoms  Please see the "Patient Instructions" section for addition details about the plan.  HPI: Cynthia Ellison is a 64 y.o. female self-referred for dysphagia and to discuss upper endoscopy. The history is obtained through the patient and some records that she brings to the appointment today. She has a history of diverticulosis, reflux, hypercholesterolemia, hypothyroidism, obesity with a maximum weight of 202 pounds, and hemorrhoids that have been banded twice. Recurrent candideal infection treated by Dr. Sharol Roussel. She works as a Immunologist at [employer].   Colonoscopy with Dr. Burt Ek at [facility] in 2006 revealed melanosis coli, diverticulosis, internal hemorrhoids, and a hyperplastic polyp.  An EGD with Dr. Burt Ek in 2008 showed a esophageal stricture that was dilated, small hiatal hernia, and gastric polyps.  EGD and Colonoscopy with Dr. Laural Golden  02/09/2015 showed non-obstructing ring dilated with 68mm Balloon and cold forceps disruption and gastric polyps. Colonoscopy showed internal hemorrhoids,  multiple diverticulosis, redundant colon, and melanosis coli.   More aware of epigastric discomfort since returning to working after the Covid shutdown. Now with solid food dysphagia occurring twice over the last 6 months, reflux, gas, eructation. Feels like foods sits at the xiphoid. Has to loosen her bra at the xiphoid after she eats.   Stopped taking prilosec after her EGD showed gastric polyps and Dr. Sharol Roussel told her that the polyps were due to a lack of acid in her stomach.   Very fearful about esophageal cancer. Young co-worker in [place] diagnosed with advanced stomach cancer at age 74.   Mother with similar problems and a hiatal hernia and Crohn's.  No known family history of colon cancer or polyps. No family history of uterine/endometrial cancer, pancreatic cancer or gastric/stomach cancer.  No esophagram.   She brings a copy of an ultrasound from 02/27/2007 showing multiple small echodensities along the wall of the gallbladder thought to be small cholesterol polyps.  No gallstones.  Calcified granulomas in the liver. 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    Past Medical History:  Diagnosis Date  . GERD (gastroesophageal reflux disease)   . PONV (postoperative nausea and vomiting)   . Vertigo, benign paroxysmal     Past Surgical History:  Procedure Laterality Date  . bilateral toe surgery    . COLONOSCOPY N/A 02/09/2015   Procedure: COLONOSCOPY;  Surgeon: Rogene Houston, MD;  Location: [facility];  Service: Endoscopy;  Laterality: N/A;  1200  . COLONOSCOPY W/ POLYPECTOMY    . ESOPHAGOGASTRODUODENOSCOPY N/A 02/09/2015  Procedure: ESOPHAGOGASTRODUODENOSCOPY (EGD);  Surgeon: Malissa HippoNajeeb U Rehman, MD;  Location: [facility];  Service: Endoscopy;  Laterality: N/A;  . MANDIBLE SURGERY     hypoplasia.    Current Outpatient Medications  Medication  Sig Dispense Refill  . BIOTIN PO Take 1 tablet by mouth daily.    . Cholecalciferol (VITAMIN D3) 5000 UNITS CAPS Take 1 capsule by mouth daily.    . Fish Oil-Cholecalciferol (FISH OIL + D3 PO) Take 2 capsules by mouth 2 (two) times daily.     . IODINE, KELP, PO Take 2 capsules by mouth 2 (two) times daily.    . Magnesium 200 MG TABS Take 1 tablet by mouth 2 (two) times daily.    . Misc Natural Products (PROGESTERONE EX) Apply 1 application topically daily. Use 3 weeks out of the month.    . Multiple Vitamin (MULTIVITAMIN) tablet Take 2 tablets by mouth 3 (three) times daily.     . Multiple Vitamins-Minerals (ZINC PO) Take by mouth.    Marland Kitchen. omeprazole (PRILOSEC) 40 MG capsule Take 1 capsule (40 mg total) by mouth daily before breakfast. (Patient not taking: Reported on 05/18/2015) 30 capsule 3  . PRESCRIPTION MEDICATION Apply 1 application topically daily. Estradiol/Testosterone    . vitamin E 400 UNIT capsule Take 400 Units by mouth 3 (three) times a week.     No current facility-administered medications for this visit.    Allergies as of 08/10/2020  . (No Known Allergies)    No family history on file.  Social History   Socioeconomic History  . Marital status: Married    Spouse name: Not on file  . Number of children: Not on file  . Years of education: Not on file  . Highest education level: Not on file  Occupational History  . Not on file  Tobacco Use  . Smoking status: Never Smoker  . Smokeless tobacco: Not on file  Substance and Sexual Activity  . Alcohol use: Yes    Alcohol/week: 0.0 standard drinks    Comment: occasional  . Drug use: No  . Sexual activity: Not on file  Other Topics Concern  . Not on file  Social History Narrative  . Not on file   Social Determinants of Health   Financial Resource Strain: Not on file  Food Insecurity: Not on file  Transportation Needs: Not on file  Physical Activity: Not on file  Stress: Not on file  Social Connections: Not on  file  Intimate Partner Violence: Not on file    Review of Systems: 12 system ROS is negative except as noted above.   Physical Exam: General:   Alert,  well-nourished, pleasant and cooperative in NAD Head:  Normocephalic and atraumatic. Eyes:  Sclera clear, no icterus.   Conjunctiva pink. Ears:  Normal auditory acuity. Nose:  No deformity, discharge,  or lesions. Mouth:  No deformity or lesions.   Neck:  Supple; no masses or thyromegaly. Lungs:  Clear throughout to auscultation.   No wheezes. Heart:  Regular rate and rhythm; no murmurs. Abdomen:  Soft,nontender, nondistended, normal bowel sounds, no rebound or guarding. No hepatosplenomegaly.   Rectal:  Deferred  Msk:  Symmetrical. No boney deformities LAD: No inguinal or umbilical LAD Extremities:  No clubbing or edema. Neurologic:  Alert and  oriented x4;  grossly nonfocal Skin:  Intact without significant lesions or rashes. Psych:  Alert and cooperative. Normal mood and affect.     Jazlynn Nemetz L. Orvan FalconerBeavers, MD, MPH 08/10/2020, 3:13 PM

## 2020-08-26 DIAGNOSIS — Z961 Presence of intraocular lens: Secondary | ICD-10-CM | POA: Diagnosis not present

## 2020-08-28 ENCOUNTER — Ambulatory Visit (HOSPITAL_COMMUNITY)
Admission: RE | Admit: 2020-08-28 | Discharge: 2020-08-28 | Disposition: A | Discharge status: Home / Self Care | Payer: Medicare Other | Source: Ambulatory Visit | Attending: Gastroenterology | Admitting: Gastroenterology

## 2020-08-28 ENCOUNTER — Other Ambulatory Visit: Payer: Self-pay

## 2020-08-28 DIAGNOSIS — K824 Cholesterolosis of gallbladder: Secondary | ICD-10-CM | POA: Diagnosis not present

## 2020-08-28 DIAGNOSIS — R1013 Epigastric pain: Secondary | ICD-10-CM | POA: Diagnosis not present

## 2020-08-29 ENCOUNTER — Ambulatory Visit (HOSPITAL_COMMUNITY): Admission: RE | Admit: 2020-08-29 | Payer: Medicare Other | Source: Ambulatory Visit

## 2020-09-04 ENCOUNTER — Encounter: Payer: Medicare Other | Admitting: Gastroenterology

## 2020-10-02 DIAGNOSIS — M25562 Pain in left knee: Secondary | ICD-10-CM | POA: Diagnosis not present

## 2020-10-02 DIAGNOSIS — G8929 Other chronic pain: Secondary | ICD-10-CM | POA: Diagnosis not present

## 2020-10-04 ENCOUNTER — Encounter: Payer: Self-pay | Admitting: Gastroenterology

## 2020-10-04 ENCOUNTER — Other Ambulatory Visit: Payer: Self-pay

## 2020-10-04 ENCOUNTER — Ambulatory Visit (AMBULATORY_SURGERY_CENTER): Payer: Medicare Other | Admitting: Gastroenterology

## 2020-10-04 VITALS — BP 132/57 | HR 65 | Temp 97.5°F | Resp 13 | Ht 68.0 in | Wt 181.0 lb

## 2020-10-04 DIAGNOSIS — K209 Esophagitis, unspecified without bleeding: Secondary | ICD-10-CM

## 2020-10-04 DIAGNOSIS — K2970 Gastritis, unspecified, without bleeding: Secondary | ICD-10-CM | POA: Diagnosis not present

## 2020-10-04 DIAGNOSIS — K222 Esophageal obstruction: Secondary | ICD-10-CM | POA: Diagnosis not present

## 2020-10-04 DIAGNOSIS — K449 Diaphragmatic hernia without obstruction or gangrene: Secondary | ICD-10-CM | POA: Diagnosis not present

## 2020-10-04 DIAGNOSIS — D131 Benign neoplasm of stomach: Secondary | ICD-10-CM

## 2020-10-04 DIAGNOSIS — K200 Eosinophilic esophagitis: Secondary | ICD-10-CM | POA: Diagnosis not present

## 2020-10-04 DIAGNOSIS — K2980 Duodenitis without bleeding: Secondary | ICD-10-CM

## 2020-10-04 DIAGNOSIS — R1310 Dysphagia, unspecified: Secondary | ICD-10-CM | POA: Diagnosis not present

## 2020-10-04 DIAGNOSIS — K317 Polyp of stomach and duodenum: Secondary | ICD-10-CM

## 2020-10-04 DIAGNOSIS — K2289 Other specified disease of esophagus: Secondary | ICD-10-CM | POA: Diagnosis not present

## 2020-10-04 MED ORDER — SODIUM CHLORIDE 0.9 % IV SOLN
500.0000 mL | Freq: Once | INTRAVENOUS | Status: DC
Start: 2020-10-04 — End: 2020-10-04

## 2020-10-04 NOTE — Progress Notes (Signed)
Report to PACU, RN, vss, BBS= Clear.  

## 2020-10-04 NOTE — Progress Notes (Signed)
approx 1034., pt heard obstructing.  Jaw thrust given to break.  sats did decrease as charted.  Pt coughing after spasm break, produced dark brown red liguid just on pillow.  EGD paused till sats 100 and breathing normal.  EGD resumed

## 2020-10-04 NOTE — Op Note (Addendum)
[facility] Patient Name: Cynthia Ellison Procedure Date: 10/04/2020 10:25 AM MRN: [id number] Endoscopist: Thornton Park MD, MD Age: 65 Referring MD:  Date of Birth: [birth year] Gender: Female Account #: [id number] Procedure:                Upper GI endoscopy Indications:              Epigastric pain - ? dyspepsia                           Recurrent, intermittent solid food dysphagia with                            history of esophageal stricture s/p dilation 2008                           - widely patent Schatzki's ring on EGD 2016, not                            disrupted by 87mm TTS balloonEpigastric pain - ?                            dyspepsia Medicines:                Monitored Anesthesia Care Procedure:                Pre-Anesthesia Assessment:                           - Prior to the procedure, a History and Physical                            was performed, and patient medications and                            allergies were reviewed. The patient's tolerance of                            previous anesthesia was also reviewed. The risks                            and benefits of the procedure and the sedation                            options and risks were discussed with the patient.                            All questions were answered, and informed consent                            was obtained. Prior Anticoagulants: The patient has                            taken no previous anticoagulant or antiplatelet  agents. ASA Grade Assessment: II - A patient with                            mild systemic disease. After reviewing the risks                            and benefits, the patient was deemed in                            satisfactory condition to undergo the procedure.                           After obtaining informed consent, the endoscope was                            passed under direct vision. Throughout the                             procedure, the patient's blood pressure, pulse, and                            oxygen saturations were monitored continuously. The                            Endoscope was introduced through the mouth, and                            advanced to the third part of duodenum. The upper                            GI endoscopy was performed with moderate                            difficulty. The patient tolerated the procedure                            well. Scope In: Scope Out: Findings:                 A widely patent Schatzki ring was found at the                            gastroesophageal junction. A TTS dilator was passed                            through the scope. Dilation with an 18-19-20 mm                            balloon dilator was performed to 20 mm. There was                            no resistance to a fully inflated balloon and I was  able to pull the inflated balloon through the mid                            and distal esophagus. The dilation site was                            examined and showed no change.                           The examined esophagus showed mild erythema at the                            GE junction. The z-line was located 35 cm from the                            incisors. After balloon dilation, biopsies were                            taken from the mid and proximal esophagus with a                            cold forceps for histology. Estimated blood loss                            was minimal.                           Patchy mildly erythematous mucosa without bleeding                            was found in the gastric body. Biopsies were taken                            from the antrum, body, and fundus with a cold                            forceps for histology. Estimated blood loss was                            minimal.                           Multiple small sessile polyps were found in the                             gastric body. Endoscopic appearance is consistent                            with fundic gland polyps. Biopsies were taken with                            a cold forceps for histology. Estimated blood loss  was minimal.                           Patchy mildly erythematous mucosa without active                            bleeding was found in the duodenal bulb. Biopsies                            were taken with a cold forceps for histology.                            Estimated blood loss was minimal.                           A 3-4 cm hiatal hernia was present. No Cameron's                            lesions. Complications:            The patient vomited a small amount of bile during                            the procedure. No witnessed aspiration occurred.                            Estimated blood loss: Minimal. Estimated Blood Loss:     Estimated blood loss was minimal. Impression:               - Widely patent Schatzki ring. Dilated.                           - Mild distal esophageal inflammation. Biopsied.                           - Erythematous mucosa in the gastric body. Biopsied.                           - Multiple gastric polyps. Likely fundic gland                            polyps. Biopsied.                           - Erythematous duodenopathy. Biopsied.                           - Medium-sized hiatal hernia. Recommendation:           - Patient has a contact number available for                            emergencies. The signs and symptoms of potential                            delayed complications were discussed with the  patient. Return to normal activities tomorrow.                            Written discharge instructions were provided to the                            patient.                           - Resume previous diet.                           - Continue present medications.                            - Await pathology results. Thornton Park MD, MD 10/04/2020 10:54:43 AM This report has been signed electronically.

## 2020-10-04 NOTE — Patient Instructions (Signed)
Please read handouts provided. Continue present medications. Await pathology results. Resume previous diet.  YOU HAD AN ENDOSCOPIC PROCEDURE TODAY AT THE [facility]:   Refer to the procedure report that was given to you for any specific questions about what was found during the examination.  If the procedure report does not answer your questions, please call your gastroenterologist to clarify.  If you requested that your care partner not be given the details of your procedure findings, then the procedure report has been included in a sealed envelope for you to review at your convenience later.  YOU SHOULD EXPECT: Some feelings of bloating in the abdomen. Passage of more gas than usual.  Walking can help get rid of the air that was put into your GI tract during the procedure and reduce the bloating. If you had a lower endoscopy (such as a colonoscopy or flexible sigmoidoscopy) you may notice spotting of blood in your stool or on the toilet paper. If you underwent a bowel prep for your procedure, you may not have a normal bowel movement for a few days.  Please Note:  You might notice some irritation and congestion in your nose or some drainage.  This is from the oxygen used during your procedure.  There is no need for concern and it should clear up in a day or so.  SYMPTOMS TO REPORT IMMEDIATELY:    Following upper endoscopy (EGD)  Vomiting of blood or coffee ground material  New chest pain or pain under the shoulder blades  Painful or persistently difficult swallowing  New shortness of breath  Fever of 100F or higher  Black, tarry-looking stools  For urgent or emergent issues, a gastroenterologist can be reached at any hour by calling [phone]. Do not use MyChart messaging for urgent concerns.    DIET:  We do recommend a small meal at first, but then you may proceed to your regular diet.  Drink plenty of fluids but you should avoid alcoholic beverages for 24  hours.  ACTIVITY:  You should plan to take it easy for the rest of today and you should NOT DRIVE or use heavy machinery until tomorrow (because of the sedation medicines used during the test).    FOLLOW UP: Our staff will call the number listed on your records 48-72 hours following your procedure to check on you and address any questions or concerns that you may have regarding the information given to you following your procedure. If we do not reach you, we will leave a message.  We will attempt to reach you two times.  During this call, we will ask if you have developed any symptoms of COVID 19. If you develop any symptoms (ie: fever, flu-like symptoms, shortness of breath, cough etc.) before then, please call [phone].  If you test positive for Covid 19 in the 2 weeks post procedure, please call and report this information to us.    If any biopsies were taken you will be contacted by phone or by letter within the next 1-3 weeks.  Please call us at [phone] if you have not heard about the biopsies in 3 weeks.    SIGNATURES/CONFIDENTIALITY: You and/or your care partner have signed paperwork which will be entered into your electronic medical record.  These signatures attest to the fact that that the information above on your After Visit Summary has been reviewed and is understood.  Full responsibility of the confidentiality of this discharge information lies with you and/or your   and/or your care-partner. 

## 2020-10-06 ENCOUNTER — Telehealth: Payer: Self-pay

## 2020-10-06 NOTE — Telephone Encounter (Signed)
Attempted to reach pt. With follow-up call following endoscopic procedure 10/04/2020.  LM on pt. Voice mail.  Will try to reach pt. Again later today.

## 2020-10-06 NOTE — Telephone Encounter (Signed)
second follow up call, no answer, LM

## 2020-10-10 DIAGNOSIS — M1712 Unilateral primary osteoarthritis, left knee: Secondary | ICD-10-CM | POA: Diagnosis not present

## 2020-10-10 DIAGNOSIS — M170 Bilateral primary osteoarthritis of knee: Secondary | ICD-10-CM | POA: Diagnosis not present

## 2020-10-11 ENCOUNTER — Encounter: Payer: Self-pay | Admitting: Gastroenterology

## 2020-10-16 DIAGNOSIS — E559 Vitamin D deficiency, unspecified: Secondary | ICD-10-CM | POA: Diagnosis not present

## 2020-10-16 DIAGNOSIS — R7303 Prediabetes: Secondary | ICD-10-CM | POA: Diagnosis not present

## 2020-10-16 DIAGNOSIS — E063 Autoimmune thyroiditis: Secondary | ICD-10-CM | POA: Diagnosis not present

## 2020-10-16 DIAGNOSIS — R971 Elevated cancer antigen 125 [CA 125]: Secondary | ICD-10-CM | POA: Diagnosis not present

## 2020-10-16 DIAGNOSIS — E782 Mixed hyperlipidemia: Secondary | ICD-10-CM | POA: Diagnosis not present

## 2020-10-16 DIAGNOSIS — E018 Other iodine-deficiency related thyroid disorders and allied conditions: Secondary | ICD-10-CM | POA: Diagnosis not present

## 2020-10-31 DIAGNOSIS — M1712 Unilateral primary osteoarthritis, left knee: Secondary | ICD-10-CM | POA: Diagnosis not present

## 2020-10-31 DIAGNOSIS — M25562 Pain in left knee: Secondary | ICD-10-CM | POA: Diagnosis not present

## 2020-11-09 DIAGNOSIS — M1712 Unilateral primary osteoarthritis, left knee: Secondary | ICD-10-CM | POA: Diagnosis not present

## 2020-11-13 DIAGNOSIS — Z0000 Encounter for general adult medical examination without abnormal findings: Secondary | ICD-10-CM | POA: Diagnosis not present

## 2020-11-13 DIAGNOSIS — Z789 Other specified health status: Secondary | ICD-10-CM | POA: Diagnosis not present

## 2020-11-13 DIAGNOSIS — E039 Hypothyroidism, unspecified: Secondary | ICD-10-CM | POA: Diagnosis not present

## 2020-11-13 DIAGNOSIS — Z7189 Other specified counseling: Secondary | ICD-10-CM | POA: Diagnosis not present

## 2020-11-13 DIAGNOSIS — Z299 Encounter for prophylactic measures, unspecified: Secondary | ICD-10-CM | POA: Diagnosis not present

## 2020-11-13 DIAGNOSIS — R5383 Other fatigue: Secondary | ICD-10-CM | POA: Diagnosis not present

## 2020-11-13 DIAGNOSIS — Z79899 Other long term (current) drug therapy: Secondary | ICD-10-CM | POA: Diagnosis not present

## 2020-11-13 DIAGNOSIS — E7800 Pure hypercholesterolemia, unspecified: Secondary | ICD-10-CM | POA: Diagnosis not present

## 2020-11-15 DIAGNOSIS — M23304 Other meniscus derangements, unspecified medial meniscus, left knee: Secondary | ICD-10-CM | POA: Diagnosis not present

## 2020-11-17 DIAGNOSIS — M23304 Other meniscus derangements, unspecified medial meniscus, left knee: Secondary | ICD-10-CM | POA: Diagnosis not present

## 2020-11-20 DIAGNOSIS — M23304 Other meniscus derangements, unspecified medial meniscus, left knee: Secondary | ICD-10-CM | POA: Diagnosis not present

## 2020-11-22 DIAGNOSIS — M23304 Other meniscus derangements, unspecified medial meniscus, left knee: Secondary | ICD-10-CM | POA: Diagnosis not present

## 2020-11-24 DIAGNOSIS — M23304 Other meniscus derangements, unspecified medial meniscus, left knee: Secondary | ICD-10-CM | POA: Diagnosis not present

## 2020-11-27 DIAGNOSIS — M23304 Other meniscus derangements, unspecified medial meniscus, left knee: Secondary | ICD-10-CM | POA: Diagnosis not present

## 2020-11-29 DIAGNOSIS — M23304 Other meniscus derangements, unspecified medial meniscus, left knee: Secondary | ICD-10-CM | POA: Diagnosis not present

## 2020-12-01 DIAGNOSIS — L739 Follicular disorder, unspecified: Secondary | ICD-10-CM | POA: Diagnosis not present

## 2020-12-01 DIAGNOSIS — L989 Disorder of the skin and subcutaneous tissue, unspecified: Secondary | ICD-10-CM | POA: Diagnosis not present

## 2020-12-05 DIAGNOSIS — L309 Dermatitis, unspecified: Secondary | ICD-10-CM | POA: Diagnosis not present

## 2020-12-14 DIAGNOSIS — M170 Bilateral primary osteoarthritis of knee: Secondary | ICD-10-CM | POA: Diagnosis not present

## 2021-01-31 DIAGNOSIS — K645 Perianal venous thrombosis: Secondary | ICD-10-CM | POA: Diagnosis not present

## 2021-02-20 DIAGNOSIS — J019 Acute sinusitis, unspecified: Secondary | ICD-10-CM | POA: Diagnosis not present

## 2021-02-20 DIAGNOSIS — B370 Candidal stomatitis: Secondary | ICD-10-CM | POA: Diagnosis not present

## 2021-04-06 DIAGNOSIS — K648 Other hemorrhoids: Secondary | ICD-10-CM | POA: Diagnosis not present

## 2021-07-16 DIAGNOSIS — J0101 Acute recurrent maxillary sinusitis: Secondary | ICD-10-CM | POA: Diagnosis not present

## 2021-09-03 DIAGNOSIS — Z961 Presence of intraocular lens: Secondary | ICD-10-CM | POA: Diagnosis not present

## 2021-10-29 DIAGNOSIS — E039 Hypothyroidism, unspecified: Secondary | ICD-10-CM | POA: Diagnosis not present

## 2021-10-29 DIAGNOSIS — R7309 Other abnormal glucose: Secondary | ICD-10-CM | POA: Diagnosis not present

## 2021-10-29 DIAGNOSIS — B3782 Candidal enteritis: Secondary | ICD-10-CM | POA: Diagnosis not present

## 2021-11-05 DIAGNOSIS — E039 Hypothyroidism, unspecified: Secondary | ICD-10-CM | POA: Diagnosis not present

## 2021-11-05 DIAGNOSIS — B3782 Candidal enteritis: Secondary | ICD-10-CM | POA: Diagnosis not present

## 2021-11-05 DIAGNOSIS — R945 Abnormal results of liver function studies: Secondary | ICD-10-CM | POA: Diagnosis not present

## 2021-11-05 DIAGNOSIS — R947 Abnormal results of other endocrine function studies: Secondary | ICD-10-CM | POA: Diagnosis not present

## 2021-11-05 DIAGNOSIS — E559 Vitamin D deficiency, unspecified: Secondary | ICD-10-CM | POA: Diagnosis not present

## 2021-11-05 DIAGNOSIS — R946 Abnormal results of thyroid function studies: Secondary | ICD-10-CM | POA: Diagnosis not present

## 2021-11-05 DIAGNOSIS — R971 Elevated cancer antigen 125 [CA 125]: Secondary | ICD-10-CM | POA: Diagnosis not present

## 2021-11-19 DIAGNOSIS — E039 Hypothyroidism, unspecified: Secondary | ICD-10-CM | POA: Diagnosis not present

## 2021-11-19 DIAGNOSIS — R946 Abnormal results of thyroid function studies: Secondary | ICD-10-CM | POA: Diagnosis not present

## 2021-11-19 DIAGNOSIS — R971 Elevated cancer antigen 125 [CA 125]: Secondary | ICD-10-CM | POA: Diagnosis not present

## 2021-11-19 DIAGNOSIS — E559 Vitamin D deficiency, unspecified: Secondary | ICD-10-CM | POA: Diagnosis not present

## 2021-11-19 DIAGNOSIS — B3782 Candidal enteritis: Secondary | ICD-10-CM | POA: Diagnosis not present

## 2021-11-19 DIAGNOSIS — R7309 Other abnormal glucose: Secondary | ICD-10-CM | POA: Diagnosis not present

## 2021-11-20 ENCOUNTER — Other Ambulatory Visit: Payer: Self-pay | Admitting: Family Medicine

## 2021-11-20 DIAGNOSIS — Z8249 Family history of ischemic heart disease and other diseases of the circulatory system: Secondary | ICD-10-CM

## 2021-11-20 DIAGNOSIS — I7090 Unspecified atherosclerosis: Secondary | ICD-10-CM

## 2021-11-21 ENCOUNTER — Other Ambulatory Visit: Payer: Self-pay | Admitting: Family Medicine

## 2021-11-21 DIAGNOSIS — M810 Age-related osteoporosis without current pathological fracture: Secondary | ICD-10-CM

## 2021-11-23 ENCOUNTER — Ambulatory Visit
Admission: RE | Admit: 2021-11-23 | Discharge: 2021-11-23 | Disposition: A | Discharge status: Home / Self Care | Payer: No Typology Code available for payment source | Source: Ambulatory Visit | Attending: Family Medicine | Admitting: Family Medicine

## 2021-11-23 DIAGNOSIS — I700 Atherosclerosis of aorta: Secondary | ICD-10-CM | POA: Diagnosis not present

## 2021-11-23 DIAGNOSIS — Z8249 Family history of ischemic heart disease and other diseases of the circulatory system: Secondary | ICD-10-CM

## 2021-11-23 DIAGNOSIS — E785 Hyperlipidemia, unspecified: Secondary | ICD-10-CM | POA: Diagnosis not present

## 2021-11-23 DIAGNOSIS — I7090 Unspecified atherosclerosis: Secondary | ICD-10-CM

## 2021-11-26 DIAGNOSIS — I7090 Unspecified atherosclerosis: Secondary | ICD-10-CM | POA: Diagnosis not present

## 2021-11-26 DIAGNOSIS — E509 Vitamin A deficiency, unspecified: Secondary | ICD-10-CM | POA: Diagnosis not present

## 2021-11-26 DIAGNOSIS — E2749 Other adrenocortical insufficiency: Secondary | ICD-10-CM | POA: Diagnosis not present

## 2021-11-26 DIAGNOSIS — Z8249 Family history of ischemic heart disease and other diseases of the circulatory system: Secondary | ICD-10-CM | POA: Diagnosis not present

## 2021-11-26 DIAGNOSIS — E018 Other iodine-deficiency related thyroid disorders and allied conditions: Secondary | ICD-10-CM | POA: Diagnosis not present

## 2021-12-06 DIAGNOSIS — Z789 Other specified health status: Secondary | ICD-10-CM | POA: Diagnosis not present

## 2021-12-06 DIAGNOSIS — R790 Abnormal level of blood mineral: Secondary | ICD-10-CM | POA: Diagnosis not present

## 2021-12-06 DIAGNOSIS — E063 Autoimmune thyroiditis: Secondary | ICD-10-CM | POA: Diagnosis not present

## 2021-12-06 DIAGNOSIS — E039 Hypothyroidism, unspecified: Secondary | ICD-10-CM | POA: Diagnosis not present

## 2022-05-14 ENCOUNTER — Other Ambulatory Visit: Payer: Medicare Other

## 2022-05-23 DIAGNOSIS — Z6824 Body mass index (BMI) 24.0-24.9, adult: Secondary | ICD-10-CM | POA: Diagnosis not present

## 2022-05-23 DIAGNOSIS — Z5320 Procedure and treatment not carried out because of patient's decision for unspecified reasons: Secondary | ICD-10-CM | POA: Diagnosis not present

## 2022-05-23 DIAGNOSIS — Z299 Encounter for prophylactic measures, unspecified: Secondary | ICD-10-CM | POA: Diagnosis not present

## 2022-05-23 DIAGNOSIS — Z789 Other specified health status: Secondary | ICD-10-CM | POA: Diagnosis not present

## 2022-05-23 DIAGNOSIS — Z0000 Encounter for general adult medical examination without abnormal findings: Secondary | ICD-10-CM | POA: Diagnosis not present

## 2022-05-23 DIAGNOSIS — R0683 Snoring: Secondary | ICD-10-CM | POA: Diagnosis not present

## 2022-05-23 DIAGNOSIS — E039 Hypothyroidism, unspecified: Secondary | ICD-10-CM | POA: Diagnosis not present

## 2022-05-23 DIAGNOSIS — Z23 Encounter for immunization: Secondary | ICD-10-CM | POA: Diagnosis not present

## 2022-05-23 DIAGNOSIS — Z7189 Other specified counseling: Secondary | ICD-10-CM | POA: Diagnosis not present

## 2022-06-07 DIAGNOSIS — G4730 Sleep apnea, unspecified: Secondary | ICD-10-CM | POA: Diagnosis not present

## 2022-06-24 DIAGNOSIS — J0100 Acute maxillary sinusitis, unspecified: Secondary | ICD-10-CM | POA: Diagnosis not present

## 2022-06-24 DIAGNOSIS — R509 Fever, unspecified: Secondary | ICD-10-CM | POA: Diagnosis not present

## 2022-06-24 DIAGNOSIS — Z20822 Contact with and (suspected) exposure to covid-19: Secondary | ICD-10-CM | POA: Diagnosis not present

## 2022-06-29 ENCOUNTER — Encounter (INDEPENDENT_AMBULATORY_CARE_PROVIDER_SITE_OTHER): Payer: Self-pay | Admitting: Gastroenterology

## 2022-07-16 DIAGNOSIS — Z0000 Encounter for general adult medical examination without abnormal findings: Secondary | ICD-10-CM | POA: Diagnosis not present

## 2022-07-16 DIAGNOSIS — Z789 Other specified health status: Secondary | ICD-10-CM | POA: Diagnosis not present

## 2022-07-16 DIAGNOSIS — E039 Hypothyroidism, unspecified: Secondary | ICD-10-CM | POA: Diagnosis not present

## 2022-07-16 DIAGNOSIS — Z299 Encounter for prophylactic measures, unspecified: Secondary | ICD-10-CM | POA: Diagnosis not present

## 2022-07-16 DIAGNOSIS — E7800 Pure hypercholesterolemia, unspecified: Secondary | ICD-10-CM | POA: Diagnosis not present

## 2022-07-16 DIAGNOSIS — R5383 Other fatigue: Secondary | ICD-10-CM | POA: Diagnosis not present

## 2022-07-17 DIAGNOSIS — E039 Hypothyroidism, unspecified: Secondary | ICD-10-CM | POA: Diagnosis not present

## 2022-07-17 DIAGNOSIS — Z79899 Other long term (current) drug therapy: Secondary | ICD-10-CM | POA: Diagnosis not present

## 2022-07-17 DIAGNOSIS — R5383 Other fatigue: Secondary | ICD-10-CM | POA: Diagnosis not present

## 2022-07-17 DIAGNOSIS — E7800 Pure hypercholesterolemia, unspecified: Secondary | ICD-10-CM | POA: Diagnosis not present

## 2022-07-18 DIAGNOSIS — K649 Unspecified hemorrhoids: Secondary | ICD-10-CM | POA: Diagnosis not present

## 2022-07-18 DIAGNOSIS — K648 Other hemorrhoids: Secondary | ICD-10-CM | POA: Diagnosis not present

## 2022-07-31 DIAGNOSIS — M1711 Unilateral primary osteoarthritis, right knee: Secondary | ICD-10-CM | POA: Diagnosis not present

## 2022-07-31 DIAGNOSIS — M25561 Pain in right knee: Secondary | ICD-10-CM | POA: Diagnosis not present

## 2022-09-06 DIAGNOSIS — I2582 Chronic total occlusion of coronary artery: Secondary | ICD-10-CM | POA: Diagnosis not present

## 2022-09-06 DIAGNOSIS — Z8249 Family history of ischemic heart disease and other diseases of the circulatory system: Secondary | ICD-10-CM | POA: Diagnosis not present

## 2022-09-06 DIAGNOSIS — I2510 Atherosclerotic heart disease of native coronary artery without angina pectoris: Secondary | ICD-10-CM | POA: Diagnosis not present

## 2022-10-29 DIAGNOSIS — R790 Abnormal level of blood mineral: Secondary | ICD-10-CM | POA: Diagnosis not present

## 2022-10-29 DIAGNOSIS — E063 Autoimmune thyroiditis: Secondary | ICD-10-CM | POA: Diagnosis not present

## 2022-10-29 DIAGNOSIS — H04123 Dry eye syndrome of bilateral lacrimal glands: Secondary | ICD-10-CM | POA: Diagnosis not present

## 2022-10-29 DIAGNOSIS — E782 Mixed hyperlipidemia: Secondary | ICD-10-CM | POA: Diagnosis not present

## 2022-10-29 DIAGNOSIS — E559 Vitamin D deficiency, unspecified: Secondary | ICD-10-CM | POA: Diagnosis not present

## 2022-10-29 DIAGNOSIS — Z961 Presence of intraocular lens: Secondary | ICD-10-CM | POA: Diagnosis not present

## 2022-10-29 DIAGNOSIS — E039 Hypothyroidism, unspecified: Secondary | ICD-10-CM | POA: Diagnosis not present

## 2022-10-29 DIAGNOSIS — H52221 Regular astigmatism, right eye: Secondary | ICD-10-CM | POA: Diagnosis not present

## 2022-10-29 DIAGNOSIS — R946 Abnormal results of thyroid function studies: Secondary | ICD-10-CM | POA: Diagnosis not present

## 2022-11-04 DIAGNOSIS — G4730 Sleep apnea, unspecified: Secondary | ICD-10-CM | POA: Diagnosis not present

## 2022-11-04 DIAGNOSIS — E039 Hypothyroidism, unspecified: Secondary | ICD-10-CM | POA: Diagnosis not present

## 2022-11-04 DIAGNOSIS — Z299 Encounter for prophylactic measures, unspecified: Secondary | ICD-10-CM | POA: Diagnosis not present

## 2022-11-04 DIAGNOSIS — Z7189 Other specified counseling: Secondary | ICD-10-CM | POA: Diagnosis not present

## 2022-11-04 DIAGNOSIS — Z0000 Encounter for general adult medical examination without abnormal findings: Secondary | ICD-10-CM | POA: Diagnosis not present

## 2022-11-26 DIAGNOSIS — G4733 Obstructive sleep apnea (adult) (pediatric): Secondary | ICD-10-CM | POA: Diagnosis not present

## 2022-12-26 DIAGNOSIS — G4733 Obstructive sleep apnea (adult) (pediatric): Secondary | ICD-10-CM | POA: Diagnosis not present

## 2023-01-26 DIAGNOSIS — G4733 Obstructive sleep apnea (adult) (pediatric): Secondary | ICD-10-CM | POA: Diagnosis not present

## 2023-01-27 DIAGNOSIS — E039 Hypothyroidism, unspecified: Secondary | ICD-10-CM | POA: Diagnosis not present

## 2023-01-27 DIAGNOSIS — E782 Mixed hyperlipidemia: Secondary | ICD-10-CM | POA: Diagnosis not present

## 2023-01-27 DIAGNOSIS — D6489 Other specified anemias: Secondary | ICD-10-CM | POA: Diagnosis not present

## 2023-01-27 DIAGNOSIS — E559 Vitamin D deficiency, unspecified: Secondary | ICD-10-CM | POA: Diagnosis not present

## 2023-01-27 DIAGNOSIS — R5382 Chronic fatigue, unspecified: Secondary | ICD-10-CM | POA: Diagnosis not present

## 2023-02-13 DIAGNOSIS — Z79899 Other long term (current) drug therapy: Secondary | ICD-10-CM | POA: Diagnosis not present

## 2023-02-13 DIAGNOSIS — E7800 Pure hypercholesterolemia, unspecified: Secondary | ICD-10-CM | POA: Diagnosis not present

## 2023-02-13 DIAGNOSIS — Z0000 Encounter for general adult medical examination without abnormal findings: Secondary | ICD-10-CM | POA: Diagnosis not present

## 2023-02-13 DIAGNOSIS — E039 Hypothyroidism, unspecified: Secondary | ICD-10-CM | POA: Diagnosis not present

## 2023-02-13 DIAGNOSIS — Z299 Encounter for prophylactic measures, unspecified: Secondary | ICD-10-CM | POA: Diagnosis not present

## 2023-02-26 DIAGNOSIS — M25562 Pain in left knee: Secondary | ICD-10-CM | POA: Diagnosis not present

## 2023-02-26 DIAGNOSIS — M25662 Stiffness of left knee, not elsewhere classified: Secondary | ICD-10-CM | POA: Diagnosis not present

## 2023-03-08 IMAGING — CT CT CARDIAC CORONARY ARTERY CALCIUM SCORE
3 series · 14 of 20 positions shown, 16 images · non-contrast
Comparison: None.

CLINICAL DATA: 66-year-old white female with history of
hyperlipidemia, not on medication by report for coronary artery
calcium scoring.

EXAM:
CT CARDIAC CORONARY ARTERY CALCIUM SCORE
TECHNIQUE: Non-contrast imaging through the heart was performed using
prospective ECG gating. Image post processing was performed on an
independent workstation, allowing for quantitative analysis of the
heart and coronary arteries. Note that this exam targets the heart
and the chest was not imaged in its entirety.

[Series 2: calcium scoring 2.00 qr36 bestdiast 70% hrt calciu · axial · 0.35mm/px · z∈[+1601,+1689]mm · 4 of 74 slices shown]
[im 15/74  vessel]
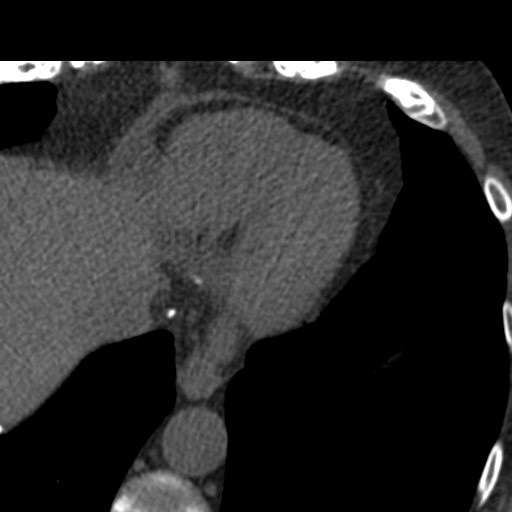
[im 30/74  vessel]
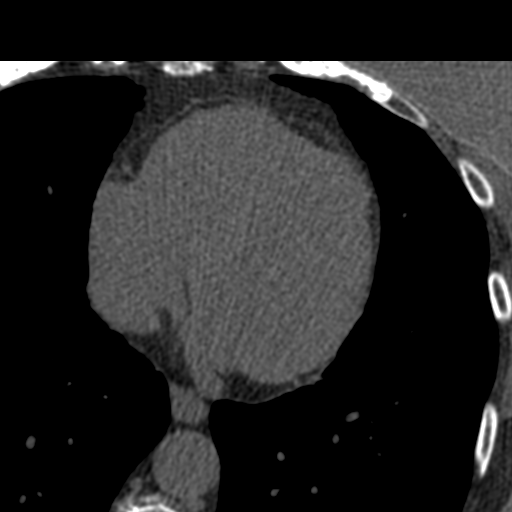
[im 44/74  vessel]
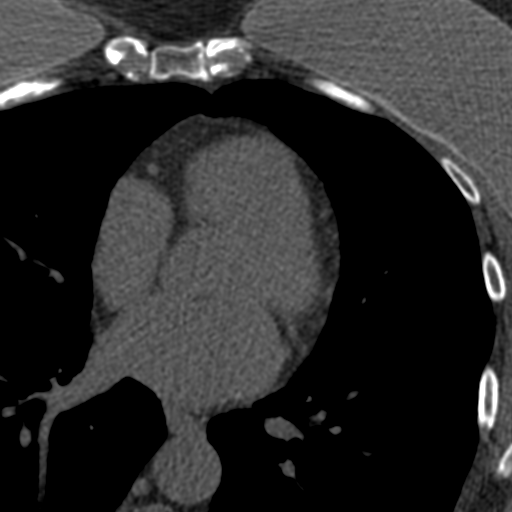
[im 59/74  vessel]
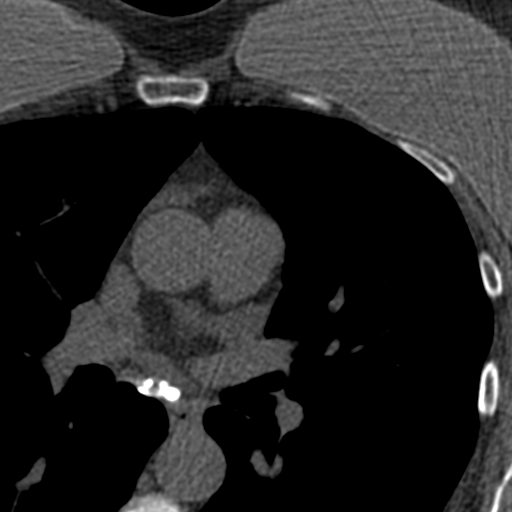

[Series 3: calcium scoring 2.00 br40 bestdiast 70% axial · axial · 0.59mm/px · z∈[+1597,+1693]mm · 5 of 74 slices shown, 7 images]
[im 13/74  vessel]
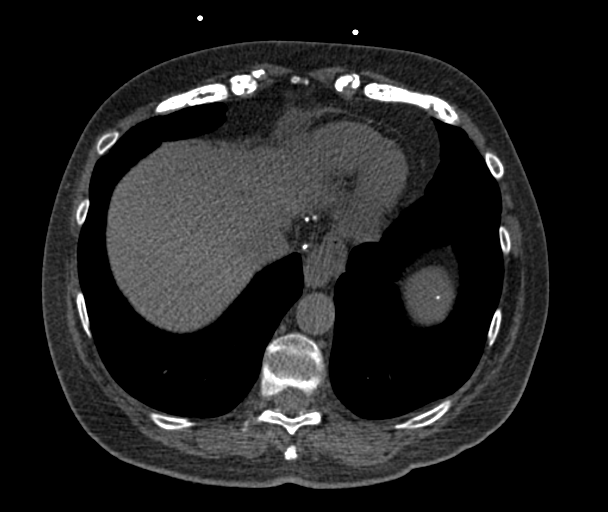
[im 13/74  lung]
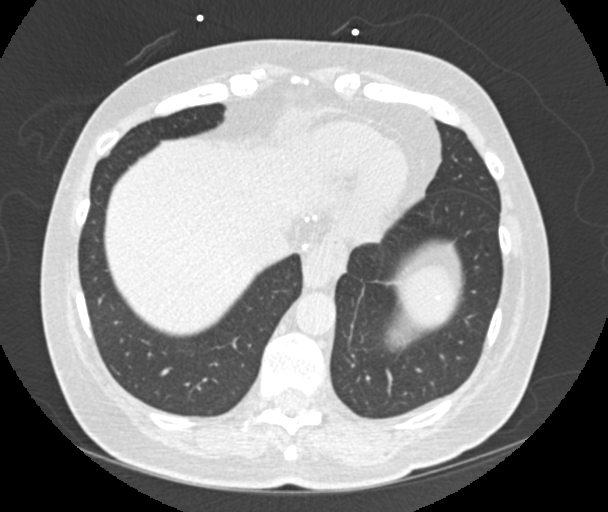
[im 25/74  vessel]
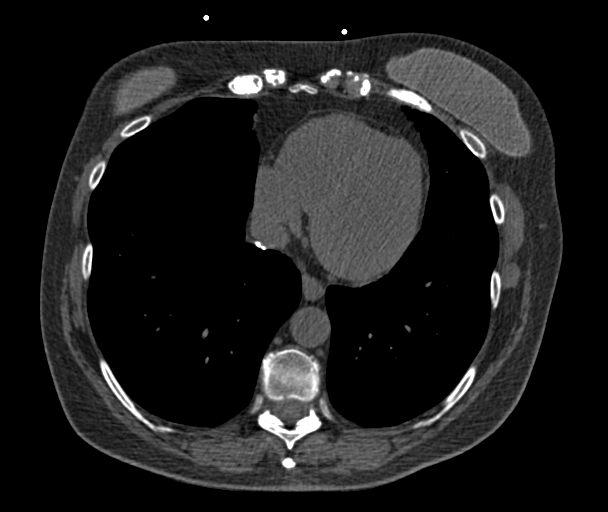
[im 37/74  vessel]
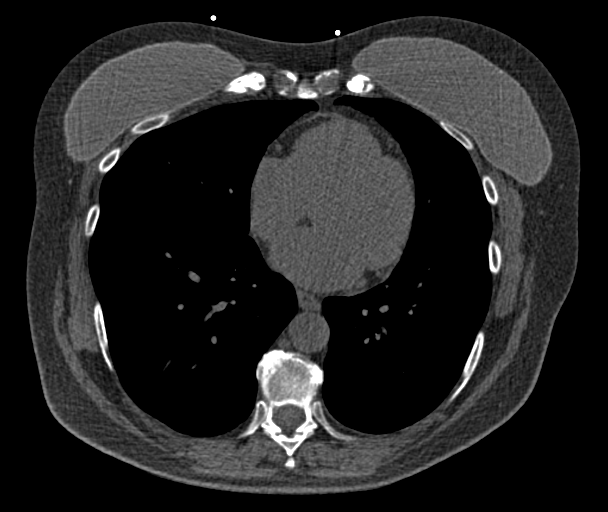
[im 49/74  vessel]
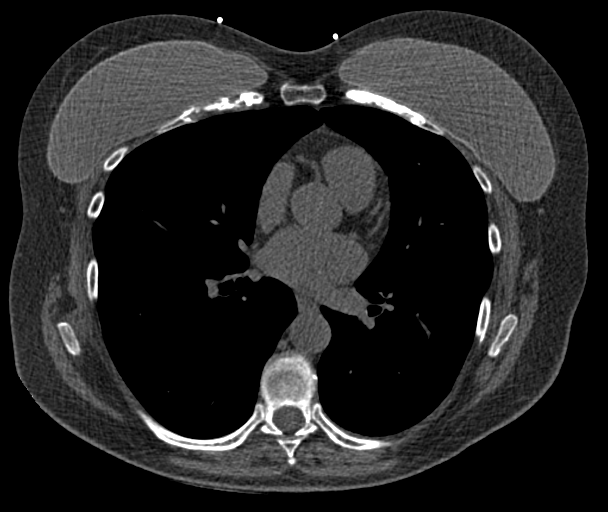
[im 61/74  vessel]
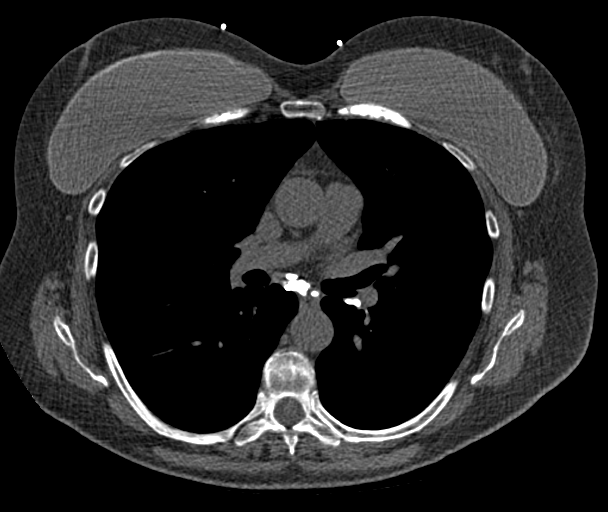
[im 61/74  lung]
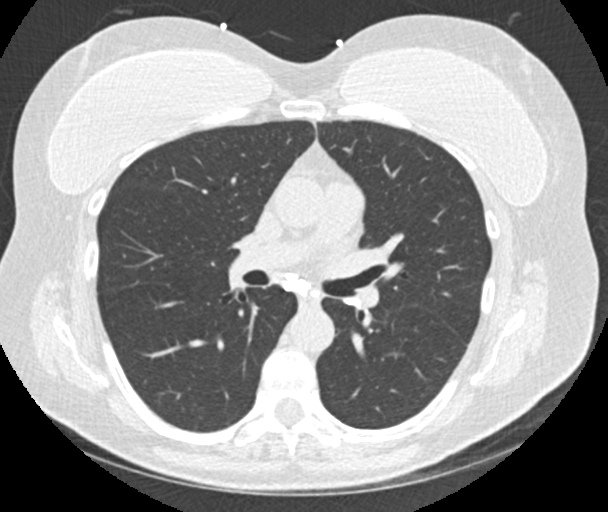

[Series 9: calcium scoring 2.00 br60 bestdiast 70% lungs · axial · 0.59mm/px · z∈[+1597,+1693]mm · 5 of 74 slices shown]
[im 13/74  vessel]
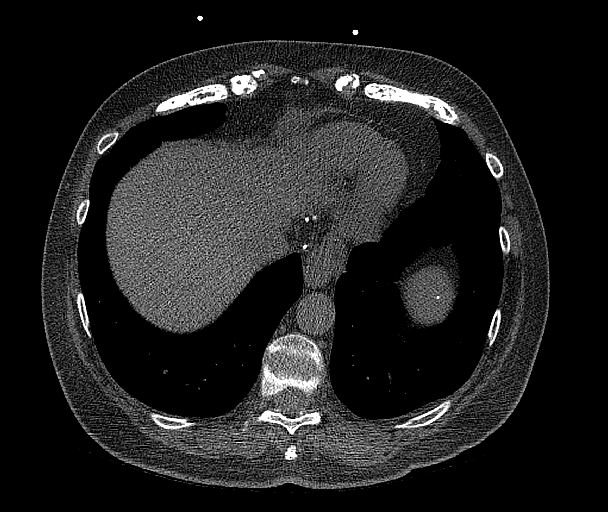
[im 25/74  vessel]
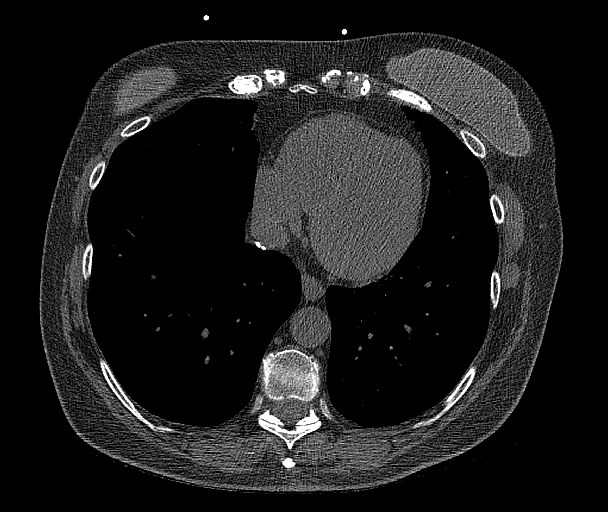
[im 37/74  vessel]
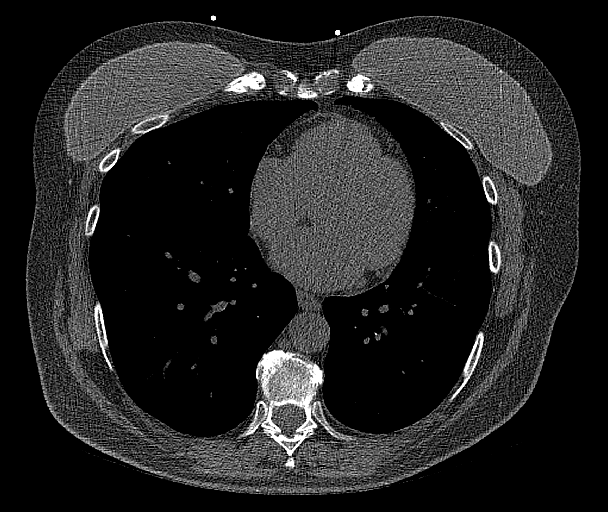
[im 49/74  vessel]
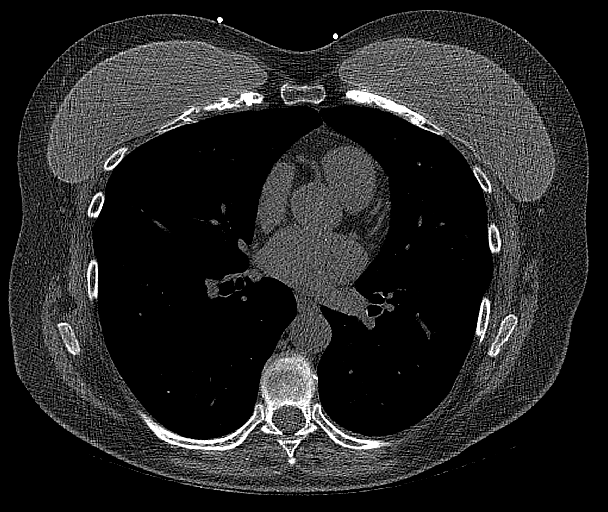
[im 61/74  vessel]
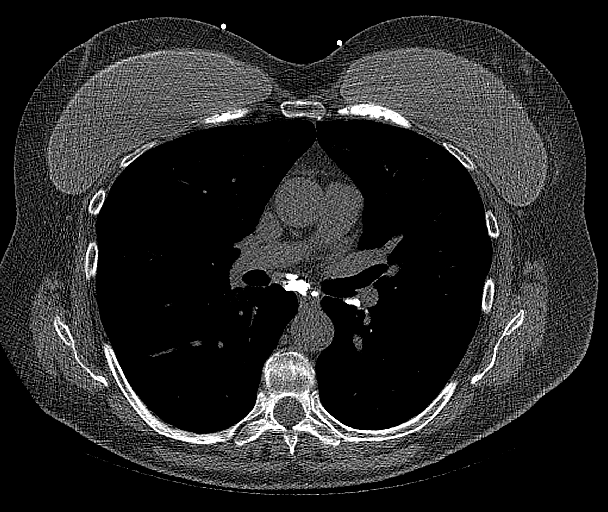

[14 of 20 positions shown; findings below may reference images not displayed]

FINDINGS: CORONARY CALCIUM SCORES:

Left Main: 0

LAD: 0

LCx: 0

RCA: 0

Total Agatston Score: 0

[HOSPITAL] percentile: 0

AORTA MEASUREMENTS:

Ascending Aorta: 29 mm

Descending Aorta: 24 mm

OTHER FINDINGS:

Cardiovascular: Heart size is normal. No signs of pericardial
effusion. Normal caliber of central pulmonary vessels. Scattered
aortic atherosclerosis with calcification.

Mediastinum/Nodes: No acute process in the mediastinum. No signs of
mediastinal adenopathy. Densely calcified lymph nodes in the
mediastinum indicative of prior granulomatous disease. Visualized
portions of the esophagus are grossly normal.

Lungs/Pleura: Visualized lungs are clear and visualized airways are
patent.

Upper Abdomen: Incidental imaging of upper abdominal contents with
signs of granulomatous disease. Granulomas in liver and spleen and
calcifications in lymph nodes in the upper abdomen.

Musculoskeletal: Bilateral breast implants. No acute bone finding.
No destructive bone process. Spinal degenerative changes.
IMPRESSION: 1. Coronary artery calcium score of 0. No signs of calcified
coronary artery disease.
2. Evidence of prior granulomatous disease.
3. Aortic atherosclerosis.

Aortic Atherosclerosis ([id number].L).

## 2023-07-01 ENCOUNTER — Telehealth: Payer: Self-pay

## 2023-07-01 NOTE — Patient Outreach (Signed)
 Successful call to patient on today regarding preventative mammogram screening. Patient declined at this time.  Baruch Gouty [facility] Assistant [facility] Population [facility] [phone]

## 2023-09-22 DIAGNOSIS — E039 Hypothyroidism, unspecified: Secondary | ICD-10-CM | POA: Diagnosis not present

## 2023-09-22 DIAGNOSIS — Z299 Encounter for prophylactic measures, unspecified: Secondary | ICD-10-CM | POA: Diagnosis not present

## 2023-10-30 DIAGNOSIS — H11153 Pinguecula, bilateral: Secondary | ICD-10-CM | POA: Diagnosis not present

## 2023-10-30 DIAGNOSIS — H52221 Regular astigmatism, right eye: Secondary | ICD-10-CM | POA: Diagnosis not present

## 2023-10-30 DIAGNOSIS — G4733 Obstructive sleep apnea (adult) (pediatric): Secondary | ICD-10-CM | POA: Diagnosis not present

## 2023-10-30 DIAGNOSIS — H43822 Vitreomacular adhesion, left eye: Secondary | ICD-10-CM | POA: Diagnosis not present

## 2023-10-30 DIAGNOSIS — Z961 Presence of intraocular lens: Secondary | ICD-10-CM | POA: Diagnosis not present

## 2023-10-30 DIAGNOSIS — H5211 Myopia, right eye: Secondary | ICD-10-CM | POA: Diagnosis not present

## 2023-11-13 DIAGNOSIS — G4733 Obstructive sleep apnea (adult) (pediatric): Secondary | ICD-10-CM | POA: Diagnosis not present

## 2023-12-15 DIAGNOSIS — G4733 Obstructive sleep apnea (adult) (pediatric): Secondary | ICD-10-CM | POA: Diagnosis not present

## 2023-12-16 DIAGNOSIS — G4733 Obstructive sleep apnea (adult) (pediatric): Secondary | ICD-10-CM | POA: Diagnosis not present

## 2024-02-16 DIAGNOSIS — H26492 Other secondary cataract, left eye: Secondary | ICD-10-CM | POA: Diagnosis not present

## 2024-02-26 DIAGNOSIS — Z299 Encounter for prophylactic measures, unspecified: Secondary | ICD-10-CM | POA: Diagnosis not present

## 2024-02-26 DIAGNOSIS — Z0000 Encounter for general adult medical examination without abnormal findings: Secondary | ICD-10-CM | POA: Diagnosis not present

## 2024-02-26 DIAGNOSIS — Z1389 Encounter for screening for other disorder: Secondary | ICD-10-CM | POA: Diagnosis not present

## 2024-02-26 DIAGNOSIS — Z7189 Other specified counseling: Secondary | ICD-10-CM | POA: Diagnosis not present

## 2024-02-26 DIAGNOSIS — G4730 Sleep apnea, unspecified: Secondary | ICD-10-CM | POA: Diagnosis not present

## 2024-02-26 DIAGNOSIS — R5383 Other fatigue: Secondary | ICD-10-CM | POA: Diagnosis not present

## 2024-02-26 DIAGNOSIS — E039 Hypothyroidism, unspecified: Secondary | ICD-10-CM | POA: Diagnosis not present

## 2024-02-26 DIAGNOSIS — E069 Thyroiditis, unspecified: Secondary | ICD-10-CM | POA: Diagnosis not present

## 2024-03-24 DIAGNOSIS — H26493 Other secondary cataract, bilateral: Secondary | ICD-10-CM | POA: Diagnosis not present

## 2024-04-14 DIAGNOSIS — H26492 Other secondary cataract, left eye: Secondary | ICD-10-CM | POA: Diagnosis not present

## 2024-04-14 DIAGNOSIS — H26491 Other secondary cataract, right eye: Secondary | ICD-10-CM | POA: Diagnosis not present

## 2024-04-14 DIAGNOSIS — H26493 Other secondary cataract, bilateral: Secondary | ICD-10-CM | POA: Diagnosis not present

## 2024-04-29 DIAGNOSIS — R52 Pain, unspecified: Secondary | ICD-10-CM | POA: Diagnosis not present

## 2024-04-29 DIAGNOSIS — Z0000 Encounter for general adult medical examination without abnormal findings: Secondary | ICD-10-CM | POA: Diagnosis not present

## 2024-04-29 DIAGNOSIS — Z79899 Other long term (current) drug therapy: Secondary | ICD-10-CM | POA: Diagnosis not present

## 2024-04-29 DIAGNOSIS — E039 Hypothyroidism, unspecified: Secondary | ICD-10-CM | POA: Diagnosis not present

## 2024-04-29 DIAGNOSIS — Z299 Encounter for prophylactic measures, unspecified: Secondary | ICD-10-CM | POA: Diagnosis not present

## 2024-04-29 DIAGNOSIS — E7800 Pure hypercholesterolemia, unspecified: Secondary | ICD-10-CM | POA: Diagnosis not present

## 2024-05-03 DIAGNOSIS — L538 Other specified erythematous conditions: Secondary | ICD-10-CM | POA: Diagnosis not present

## 2024-05-03 DIAGNOSIS — R208 Other disturbances of skin sensation: Secondary | ICD-10-CM | POA: Diagnosis not present

## 2024-05-03 DIAGNOSIS — Z789 Other specified health status: Secondary | ICD-10-CM | POA: Diagnosis not present

## 2024-05-03 DIAGNOSIS — L820 Inflamed seborrheic keratosis: Secondary | ICD-10-CM | POA: Diagnosis not present

## 2024-05-17 ENCOUNTER — Encounter (INDEPENDENT_AMBULATORY_CARE_PROVIDER_SITE_OTHER): Payer: Self-pay | Admitting: *Deleted

## 2024-05-31 DIAGNOSIS — G4733 Obstructive sleep apnea (adult) (pediatric): Secondary | ICD-10-CM | POA: Diagnosis not present
